# Patient Record
Sex: Female | Born: 1965 | Race: White | Hispanic: No | Marital: Married | Smoking: Current every day smoker
Health system: Southern US, Community
[De-identification: ages and names within clinical notes are randomized; demographics above are authoritative.]

## PROBLEM LIST (undated history)

## (undated) DIAGNOSIS — G4730 Sleep apnea, unspecified: Secondary | ICD-10-CM

## (undated) DIAGNOSIS — G709 Myoneural disorder, unspecified: Secondary | ICD-10-CM

## (undated) DIAGNOSIS — L409 Psoriasis, unspecified: Secondary | ICD-10-CM

## (undated) DIAGNOSIS — C801 Malignant (primary) neoplasm, unspecified: Secondary | ICD-10-CM

## (undated) HISTORY — PX: LAPAROSCOPY: SHX197

## (undated) HISTORY — PX: LAMINECTOMY: SHX219

---

## 2007-04-25 HISTORY — PX: ABDOMINAL HYSTERECTOMY: SHX81

## 2015-04-25 HISTORY — PX: TOTAL KNEE ARTHROPLASTY: SHX125

## 2018-04-24 DIAGNOSIS — K449 Diaphragmatic hernia without obstruction or gangrene: Secondary | ICD-10-CM

## 2018-04-24 HISTORY — DX: Diaphragmatic hernia without obstruction or gangrene: K44.9

## 2019-03-03 ENCOUNTER — Other Ambulatory Visit: Payer: Self-pay | Admitting: Urology

## 2019-03-31 NOTE — Patient Instructions (Signed)
DUE TO COVID-19 ONLY ONE VISITOR IS ALLOWED TO COME WITH YOU AND STAY IN THE WAITING ROOM ONLY DURING PRE OP AND PROCEDURE DAY OF SURGERY. THE 1 VISITOR MAY VISIT WITH YOU AFTER SURGERY IN YOUR PRIVATE ROOM DURING VISITING HOURS ONLY!  YOU NEED TO HAVE A COVID 19 TEST ON____12/10/2020___ @___1240____ , THIS TEST MUST BE DONE BEFORE SURGERY, COME  Julia Mcpherson  , [id number].  ([place]) ONCE YOUR COVID TEST IS COMPLETED, PLEASE BEGIN THE QUARANTINE INSTRUCTIONS AS OUTLINED IN YOUR HANDOUT.                Julia Mcpherson   Your procedure is scheduled on:    Report to [facility] Main  Entrance   Report to admitting at 0530 AM   Julia Mcpherson   Call this number if you have problems the morning of surgery [phone]    Remember: Do not eat food or drink liquids :After Midnight. BRUSH YOUR TEETH MORNING OF SURGERY AND RINSE YOUR MOUTH OUT, NO CHEWING GUM CANDY OR MINTS.     Take these medicines the morning of surgery with A SIP OF WATER: Protonix, Gabapentin. Zofran, Ultram and Robaxin as needed.                                You may not have any metal on your body including hair pins and              piercings  Do not wear jewelry, make-up, lotions, powders or perfumes, deodorant             Do not wear nail polish on your fingernails.  Do not shave  48 hours prior to surgery.              Men may shave face and neck.   Do not bring valuables to the hospital. [place].  Contacts, dentures or bridgework may not be worn into surgery.  Leave suitcase in the car. After surgery it may be brought to your room.   Julia Mcpherson - Preparing for Surgery Before surgery, you can play an important role.  Because skin is not sterile, your skin needs to be as free of germs as possible.  You can reduce the number of germs on your skin by washing with CHG (chlorahexidine  gluconate) soap before surgery.  CHG is an antiseptic cleaner which kills germs and bonds with the skin to continue killing germs even after washing. Please DO NOT use if you have an allergy to CHG or antibacterial soaps.  If your skin becomes reddened/irritated stop using the CHG and inform your nurse when you arrive at Short Stay. Do not shave (including legs and underarms) for at least 48 hours prior to the first CHG shower.  You may shave your face/neck.  Please follow these instructions carefully:  1.  Shower with CHG Soap the night before surgery and the  morning of surgery.  2.  If you choose to wash your hair, wash your hair first as usual with your normal  shampoo.  3.  After you shampoo, rinse your hair and body thoroughly to remove the shampoo.  4.  Use CHG as you would any other liquid soap.  You can apply chg directly to the skin and wash.  Gently with a scrungie or clean washcloth.  5.  Apply the CHG Soap to your body ONLY FROM THE NECK DOWN.   Do   not use on face/ open                           Wound or open sores. Avoid contact with eyes, ears mouth and   genitals (private parts).                       Wash face,  Genitals (private parts) with your normal soap.             6.  Wash thoroughly, paying special attention to the area where your    surgery  will be performed.  7.  Thoroughly rinse your body with warm water from the neck down.  8.  DO NOT shower/wash with your normal soap after using and rinsing off the CHG Soap.                9.  Pat yourself dry with a clean towel.            10.  Wear clean pajamas.            11.  Place clean sheets on your bed the night of your first shower and do not  sleep with pets. Day of Surgery : Do not apply any lotions/deodorants the morning of surgery.  Please wear clean clothes to the hospital/surgery center.  FAILURE TO FOLLOW THESE INSTRUCTIONS MAY RESULT IN THE CANCELLATION OF YOUR SURGERY  PATIENT  SIGNATURE_________________________________  NURSE SIGNATURE__________________________________  ________________________________________________________________________     Julia Mcpherson  An incentive spirometer is a tool that can help keep your lungs clear and active. This tool measures how well you are filling your lungs with each breath. Taking long deep breaths may help reverse or decrease the chance of developing breathing (pulmonary) problems (especially infection) following:  A long period of time when you are unable to move or be active. BEFORE THE PROCEDURE   If the spirometer includes an indicator to show your best effort, your nurse or respiratory therapist will set it to a desired goal.  If possible, sit up straight or lean slightly forward. Try not to slouch.  Hold the incentive spirometer in an upright position. INSTRUCTIONS FOR USE  1. Sit on the edge of your bed if possible, or sit up as far as you can in bed or on a chair. 2. Hold the incentive spirometer in an upright position. 3. Breathe out normally. 4. Place the mouthpiece in your mouth and seal your lips tightly around it. 5. Breathe in slowly and as deeply as possible, raising the piston or the ball toward the top of the column. 6. Hold your breath for 3-5 seconds or for as long as possible. Allow the piston or ball to fall to the bottom of the column. 7. Remove the mouthpiece from your mouth and breathe out normally. 8. Rest for a few seconds and repeat Steps 1 through 7 at least 10 times every 1-2 hours when you are awake. Take your time and take a few normal breaths between deep breaths. 9. The spirometer may include an indicator to show your best effort. Use the indicator as a goal to work toward during  each repetition. 10. After each set of 10 deep breaths, practice coughing to be sure your lungs are clear. If you have an incision (the cut made at the time of surgery), support your incision when  coughing by placing a pillow or rolled up towels firmly against it. Once you are able to get out of bed, walk around indoors and cough well. You may stop using the incentive spirometer when instructed by your caregiver.  RISKS AND COMPLICATIONS  Take your time so you do not get dizzy or light-headed.  If you are in pain, you may need to take or ask for pain medication before doing incentive spirometry. It is harder to take a deep breath if you are having pain. AFTER USE  Rest and breathe slowly and easily.  It can be helpful to keep track of a log of your progress. Your caregiver can provide you with a simple table to help with this. If you are using the spirometer at home, follow these instructions: [place] IF:   You are having difficultly using the spirometer.  You have trouble using the spirometer as often as instructed.  Your pain medication is not giving enough relief while using the spirometer.  You develop fever of 100.5 F (38.1 C) or higher. SEEK IMMEDIATE MEDICAL CARE IF:   You cough up bloody sputum that had not been present before.  You develop fever of 102 F (38.9 C) or greater.  You develop worsening pain at or near the incision site. MAKE SURE YOU:   Understand these instructions.  Will watch your condition.  Will get help right away if you are not doing well or get worse. Document Released: 08/21/2006 Document Revised: 07/03/2011 Document Reviewed: 10/22/2006 [facility] Patient Information 2014 [place], [place].   ________________________________________________________________________

## 2019-04-03 ENCOUNTER — Encounter (HOSPITAL_COMMUNITY)
Admission: RE | Admit: 2019-04-03 | Discharge: 2019-04-03 | Disposition: A | Discharge status: Home / Self Care | Payer: 59 | Source: Ambulatory Visit | Attending: Urology | Admitting: Urology

## 2019-04-03 ENCOUNTER — Other Ambulatory Visit (HOSPITAL_COMMUNITY)
Admission: RE | Admit: 2019-04-03 | Discharge: 2019-04-03 | Disposition: A | Discharge status: Home / Self Care | Payer: 59 | Source: Ambulatory Visit | Attending: Urology | Admitting: Urology

## 2019-04-03 ENCOUNTER — Encounter (HOSPITAL_COMMUNITY): Payer: Self-pay

## 2019-04-03 ENCOUNTER — Other Ambulatory Visit: Payer: Self-pay

## 2019-04-03 DIAGNOSIS — Z20828 Contact with and (suspected) exposure to other viral communicable diseases: Secondary | ICD-10-CM | POA: Diagnosis not present

## 2019-04-03 DIAGNOSIS — I4510 Unspecified right bundle-branch block: Secondary | ICD-10-CM | POA: Insufficient documentation

## 2019-04-03 DIAGNOSIS — Z01812 Encounter for preprocedural laboratory examination: Secondary | ICD-10-CM | POA: Insufficient documentation

## 2019-04-03 DIAGNOSIS — Z01810 Encounter for preprocedural cardiovascular examination: Secondary | ICD-10-CM | POA: Diagnosis present

## 2019-04-03 HISTORY — DX: Psoriasis, unspecified: L40.9

## 2019-04-03 HISTORY — DX: Sleep apnea, unspecified: G47.30

## 2019-04-03 HISTORY — DX: Malignant (primary) neoplasm, unspecified: C80.1

## 2019-04-03 HISTORY — DX: Myoneural disorder, unspecified: G70.9

## 2019-04-03 LAB — BASIC METABOLIC PANEL
Anion gap: 10 (ref 5–15)
BUN: 9 mg/dL (ref 6–20)
CO2: 25 mmol/L (ref 22–32)
Calcium: 9.3 mg/dL (ref 8.9–10.3)
Chloride: 101 mmol/L (ref 98–111)
Creatinine, Ser: 0.7 mg/dL (ref 0.44–1.00)
GFR calc Af Amer: >60 mL/min (ref >60)
GFR calc non Af Amer: >60 mL/min (ref >60)
Glucose, Bld: 96 mg/dL (ref 70–99)
Potassium: 4 mmol/L (ref 3.5–5.1)
Sodium: 136 mmol/L (ref 135–145)

## 2019-04-03 LAB — CBC
HCT: 44.4 % (ref 36.0–46.0)
Hemoglobin: 14.1 g/dL (ref 12.0–15.0)
MCH: 31.5 pg (ref 26.0–34.0)
MCHC: 31.8 g/dL (ref 30.0–36.0)
MCV: 99.1 fL (ref 80.0–100.0)
Platelets: 322 10*3/uL (ref 150–400)
RBC: 4.48 MIL/uL (ref 3.87–5.11)
RDW: 14.2 % (ref 11.5–15.5)
WBC: 5.6 10*3/uL (ref 4.0–10.5)
nRBC: 0 % (ref 0.0–0.2)

## 2019-04-03 LAB — ABO/RH: ABO/RH(D): O POS

## 2019-04-03 MED ORDER — MAGNESIUM CITRATE PO SOLN: 1.0000 | Freq: Once | ORAL | Status: DC

## 2019-04-03 NOTE — Progress Notes (Signed)
PCP - Gar Ponto, MD Cardiologist - n/a  Chest x-ray - n/a EKG - 04/03/2019 Stress Test -n/a  ECHO - n/a Cardiac Cath - n/a  Sleep Study - yes  CPAP - yes  Fasting Blood Sugar - n/a Checks Blood Sugar _____ times a day  Blood Thinner Instructions:NONE Aspirin Instructions: Last Dose:  Anesthesia review: No review needed.  Patient denies shortness of breath, fever, cough and chest pain at PAT appointment   Patient verbalized understanding of instructions that were given to them at the PAT appointment. Patient was also instructed that they will need to review over the PAT instructions again at home before surgery.

## 2019-04-04 LAB — NOVEL CORONAVIRUS, NAA (HOSP ORDER, SEND-OUT TO REF LAB; TAT 18-24 HRS): SARS-CoV-2, NAA: NOT DETECTED

## 2019-04-04 NOTE — H&P (Signed)
Julia Mcpherson         MRN: [id number]  DOB: April 07, 1966, 53 year old Female  SSN:    PRIMARY CARE:  Julia Na. Quillian Quince, MD  REFERRING:  Julia Na. Quillian Quince, MD  PROVIDER:  Raynelle Bring, M.D.  LOCATION:  [facility] [phone]      CC/HPI: CC: Left renal neoplasm  Physician requesting consult: Dr. Gar Ponto  Primary care physician: Dr. 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 She was noted to also have microscopic hematuria. Cystoscopy was unremarkable. She returns today after undergoing a hematuria protocol CT scan and to discuss options for treatment of her renal lesion.   Family history of kidney cancer: No.  Family history of ESRD: No.   Imaging: CT scan (02/11/19)  Side of renal neoplasm: Left  Size of renal neoplasm: 2.9 cm  Location of renal neoplasm: Interpolar  Exophytic or endophytic: Partially exophytic  Renal nephrometry score: 8x   Renal artery anatomy: Single renal artery  Renal vein anatomy: Single renal vein   Contralateral renal lesions: None.  Regional lymphadenopathy: None.  Adrenal masses: None.  Renal vein/IVC involvement: No.  Metastatic disease to the abdomen: No.   Chest imaging: CT scan (02/11/19): Negative.  LFTs: Normal.   Baseline renal function: Cr 0.5, eGFR > 60 ml/min   PMH: Past medical history is significant for hyperlipidemia, depression, anxiety, arthritis, GERD, sleep apnea.  PSH: TAH-BSO  ALLERGIES: Ace Inhibitors Codeine enbel sulfa   MEDICATIONS: Aspirin 81 mg tablet,chewable  Lipitor  Buspirone Hcl 5 mg tablet  Estradiol 0.1 mg/24 hour patch, transdermal weekly  Folic Acid  Methotrexate 25 mg/ml vial  Neurontin  Protonix 40 mg tablet, delayed release  Robaxin-750  Tramadol Hcl 100 mg tablet  Vitamin D2    Zofran    GU PSH: Cystoscopy - 02/04/2019 Locm 300-399Mg/Ml Iodine,1Ml - 02/11/2019, 02/11/2019     PSH Notes: total abdominal hysterectomy,    NON-GU PSH: Laminotomy; Single Lumbar Total Knee Replacement, Right   GU PMH: Left renal neoplasm - 02/04/2019 Microscopic hematuria - 02/04/2019   NON-GU PMH: Anxiety Arthritis Depression GERD Hypercholesterolemia Psoriasis, unspecified Sleep Apnea   FAMILY HISTORY: Heart Attack - Mother stroke - Mother   SOCIAL HISTORY: Marital Status: Married Preferred Language: English; Ethnicity: Not Hispanic Or Latino; Race: White Current Smoking Status: Patient smokes.   Tobacco Use Assessment Completed:  Used Tobacco in last 30 days?  Has never drank.  Drinks 3 caffeinated drinks per day.   REVIEW OF SYSTEMS:    GU Review Female:   Patient denies frequent urination, hard to postpone urination, burning /pain with urination, get up at night to urinate, leakage of urine, stream starts and stops, trouble starting your stream, have to strain to urinate, and currently pregnant.  Gastrointestinal (Lower):   Patient denies diarrhea and constipation.  Gastrointestinal (Upper):   Patient denies nausea and vomiting.  Constitutional:   Patient denies fever, night sweats, weight loss, and fatigue.  Skin:   Patient denies skin rash/ lesion and itching.  Eyes:   Patient denies blurred vision and double vision.  Ears/ Nose/ Throat:   Patient denies sinus problems and sore throat.  Hematologic/Lymphatic:   Patient denies swollen glands and easy bruising.  Cardiovascular:   Patient denies leg swelling  and chest pains.  Respiratory:   Patient denies cough and shortness of breath.  Endocrine:   Patient denies excessive thirst.  Musculoskeletal:   Patient denies back pain and joint pain.  Neurological:   Patient denies headaches and dizziness.  Psychologic:   Patient denies depression and anxiety.   VITAL SIGNS:      02/25/2019 03:04 PM  Weight 250 lb  / 113.4 kg  Height 67 in / 170.18 cm  BP 138/80 mmHg  Pulse 112 /min  Temperature 96.9 F / 36.0 C  BMI 39.2 kg/m   MULTI-SYSTEM PHYSICAL EXAMINATION:    Constitutional: Well-nourished. No physical deformities. Normally developed. Good grooming.  Neck: Neck symmetrical, not swollen. Normal tracheal position.  Respiratory: No labored breathing, no use of accessory muscles. Clear bilaterally.  Cardiovascular: Normal temperature, normal extremity pulses, no swelling, no varicosities. Regular rate and rhythm.  Lymphatic: No enlargement of neck, axillae, groin.  Skin: No paleness, no jaundice, no cyanosis. No lesion, no ulcer, no rash.  Neurologic / Psychiatric: Oriented to time, oriented to place, oriented to person. No depression, no anxiety, no agitation.  Gastrointestinal: No mass, no tenderness, no rigidity, obese abdomen. Well-healed lower midline incision.  Eyes: Normal conjunctivae. Normal eyelids.  Ears, Nose, Mouth, and Throat: Left ear no scars, no lesions, no masses. Right ear no scars, no lesions, no masses. Nose no scars, no lesions, no masses. Normal hearing. Normal lips.  Musculoskeletal: Normal gait and station of head and neck.    PAST DATA REVIEWED:  Source Of History:  Patient  Lab Test Review:   CMP  Urine Test Review:   Urinalysis  X-Ray Review: C.T. Abdomen/Pelvis: Reviewed Films.   Notes:                     CLINICAL DATA: Microhematuria   EXAM:  CT CHEST WITH CONTRAST   CT ABDOMEN AND PELVIS WITHOUT AND WITH CONTRAST   TECHNIQUE:  Multidetector CT imaging of the chest was performed following the  standard protocol following the bolus administration of intravenous  contrast.   Multidetector CT imaging of the abdomen and pelvis was performed  following the standard protocol before and following the bolus  administration of intravenous contrast.   CONTRAST: 125 mL Omnipaque 300 iodinated contrast IV   COMPARISON: None.   FINDINGS:  CT CHEST    Cardiovascular: Aortic atherosclerosis. Normal heart size. Coronary  artery calcifications. No pericardial effusion.   Mediastinum/Nodes: No enlarged mediastinal, hilar, or axillary lymph  nodes. Thyroid gland, trachea, and esophagus demonstrate no  significant findings.   Lungs/Pleura: Lungs are clear. No pleural effusion or pneumothorax.   Upper Abdomen: No acute abnormality.   Musculoskeletal: No chest wall mass or suspicious bone lesions  identified.   CT ABDOMEN AND PELVIS   Hepatobiliary: No solid liver abnormality is seen. No gallstones,  gallbladder wall thickening, or biliary dilatation.   Pancreas: Unremarkable. No pancreatic ductal dilatation or  surrounding inflammatory changes.   Spleen: Normal in size without significant abnormality.   Adrenals/Urinary Tract: Adrenal glands are unremarkable. No evidence  of urinary tract calculus or hydronephrosis. There is a complex  partially exophytic lesion of the lateral midportion of the left  kidney with thick, contrast enhancing septa and a possible mural  nodule, overall lesion dimensions measuring the 2.9 x 2.3 x 2.1 cm  (series 5, image 67, series 700, image 95). The left ureter is  poorly opacified on delayed phase imaging. No filling defect or  abnormality  of the right ureter or urinary bladder.   Stomach/Bowel: Stomach is within normal limits. Appendix appears  normal. No evidence of bowel wall thickening, distention, or  inflammatory changes. Occasional sigmoid diverticula.   Vascular/Lymphatic: Aortic atherosclerosis. No enlarged abdominal or  pelvic lymph nodes.   Reproductive: Status post hysterectomy.   Other: No abdominal wall hernia or abnormality. No abdominopelvic  ascites.   Musculoskeletal: No acute or significant osseous findings.   IMPRESSION:  1. There is a complex partially exophytic lesion of the lateral  midportion of the left kidney with thick, contrast enhancing septa  and a possible  mural nodule, overall lesion dimensions measuring the  2.9 x 2.3 x 2.1 cm (series 5, image 67, series 700, image 95).  Findings are highly concerning for renal cell carcinoma.   2. The left ureter is poorly opacified on delayed phase imaging. No  filling defect or abnormality of the right ureter or urinary  bladder.   3. No evidence of metastatic disease in the chest, abdomen, or  pelvis.   4. Coronary artery disease. Aortic Atherosclerosis (ICD10-I70.0).    Electronically Signed  By: Eddie Candle M.D.  On: 02/11/2019 16:58    PROCEDURES:         Urinalysis w/Scope - 81001 Dipstick Dipstick Cont'd Micro  Color: Yellow Bilirubin: Neg WBC/hpf: 0 - 5/hpf  Appearance: Clear Ketones: Neg RBC/hpf: 3 - 10/hpf  Specific Gravity: 1.015 Blood: Trace Bacteria: Rare (0-9/hpf)  pH: 6.5 Protein: Neg Cystals: NS (Not Seen)  Glucose: Neg Urobilinogen: 0.2 Casts: Hyaline    Nitrites: Neg Trichomonas: Not Present    Leukocyte Esterase: Neg Mucous: Not Present      Epithelial Cells: 0 - 5/hpf      Yeast: NS (Not Seen)      Sperm: Not Present   Notes:    ASSESSMENT:      ICD-10 Details  1 GU:   Left renal neoplasm - X52.841   2   Microscopic hematuria - R31.21    PLAN:          Schedule Return Visit/Planned Activity: Other See Visit Notes             Note: Will call to schedule surgery.         Document Letter(s):  Created for Patient: Clinical Summary        Notes:   1. Left renal neoplasm concerning for malignancy: We reviewed her CT scan findings which confirmed that she has a partially cystic but enhancing lesion measuring 2.9 cm off the left kidney. 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 The patient was provided information regarding their renal mass including the relative risk of benign versus malignant pathology and the natural history of renal cell carcinoma and other possible malignancies of the kidney. The role of renal biopsy, laboratory testing, and imaging  studies to further characterize renal masses and/or the presence of metastatic disease were explained. We discussed the role of active surveillance, surgical therapy with both radical nephrectomy and nephron-sparing surgery, and ablative therapy in the treatment of renal masses. In addition, we discussed our goals of providing an accurate diagnosis and oncologic control while maintaining optimal renal function as appropriate based on the size, location, and complexity of their renal mass as well as their co-morbidities. We have discussed the risks of treatment in detail including but not limited to bleeding, infection, heart attack, stroke, death, venothromoboembolism, cancer recurrence, injury/damage to surrounding organs and structures, urine leak, the possibility of  open surgical conversion for patients undergoing minimally invasive surgery, the risk of developing chronic kidney disease and its associated implications, and the potential risk of end stage renal disease possibly necessitating dialysis.   After reviewing options, she does wish to proceed with a left robot assisted laparoscopic partial nephrectomy in this will be scheduled.   2. Microscopic hematuria: Her recent cystoscopy did demonstrate an erythematous area adjacent to the dome of the bladder. Her bladder washing for cytology did demonstrate some rare atypical cells. We discussed these findings in addition to her hematuria protocol CT scan. Ultimately, considering the fact that she is going to undergo general anesthesia for treatment of her renal lesion, we discussed the option of proceeding with cystoscopy and biopsy of her erythematous lesion she did wish to proceed with this. Potential risks, complications, and expected recovery process was discussed. We will therefore plan to perform this at the time of a partial nephrectomy.   Cc: Dr. Gar Ponto

## 2019-04-06 MED ORDER — DEXTROSE 5 % IV SOLN
3.0000 g | Freq: Once | INTRAVENOUS | Status: AC
  Administered 2019-04-07: 3 g via INTRAVENOUS
  Filled 2019-04-06: qty 3

## 2019-04-07 ENCOUNTER — Other Ambulatory Visit: Payer: Self-pay

## 2019-04-07 ENCOUNTER — Encounter (HOSPITAL_COMMUNITY)
Admission: RE | Disposition: A | Discharge status: Home / Self Care | Payer: Self-pay | Source: Home / Self Care | Attending: Urology

## 2019-04-07 ENCOUNTER — Observation Stay (HOSPITAL_COMMUNITY)
Admission: RE | Admit: 2019-04-07 | Discharge: 2019-04-08 | Disposition: A | Discharge status: Home / Self Care | Payer: 59 | Attending: Urology | Admitting: Urology

## 2019-04-07 ENCOUNTER — Encounter (HOSPITAL_COMMUNITY): Payer: Self-pay | Admitting: Urology

## 2019-04-07 ENCOUNTER — Ambulatory Visit (HOSPITAL_COMMUNITY): Payer: 59 | Admitting: Physician Assistant

## 2019-04-07 ENCOUNTER — Ambulatory Visit (HOSPITAL_COMMUNITY): Payer: 59 | Admitting: Certified Registered Nurse Anesthetist

## 2019-04-07 DIAGNOSIS — D49512 Neoplasm of unspecified behavior of left kidney: Secondary | ICD-10-CM | POA: Diagnosis present

## 2019-04-07 DIAGNOSIS — Z882 Allergy status to sulfonamides status: Secondary | ICD-10-CM | POA: Insufficient documentation

## 2019-04-07 DIAGNOSIS — E785 Hyperlipidemia, unspecified: Secondary | ICD-10-CM | POA: Insufficient documentation

## 2019-04-07 DIAGNOSIS — F17200 Nicotine dependence, unspecified, uncomplicated: Secondary | ICD-10-CM | POA: Diagnosis not present

## 2019-04-07 DIAGNOSIS — F419 Anxiety disorder, unspecified: Secondary | ICD-10-CM | POA: Diagnosis not present

## 2019-04-07 DIAGNOSIS — E7800 Pure hypercholesterolemia, unspecified: Secondary | ICD-10-CM | POA: Diagnosis not present

## 2019-04-07 DIAGNOSIS — Z79899 Other long term (current) drug therapy: Secondary | ICD-10-CM | POA: Insufficient documentation

## 2019-04-07 DIAGNOSIS — Z6841 Body Mass Index (BMI) 40.0 and over, adult: Secondary | ICD-10-CM | POA: Diagnosis not present

## 2019-04-07 DIAGNOSIS — Z7982 Long term (current) use of aspirin: Secondary | ICD-10-CM | POA: Insufficient documentation

## 2019-04-07 DIAGNOSIS — G709 Myoneural disorder, unspecified: Secondary | ICD-10-CM | POA: Insufficient documentation

## 2019-04-07 DIAGNOSIS — C642 Malignant neoplasm of left kidney, except renal pelvis: Secondary | ICD-10-CM | POA: Diagnosis not present

## 2019-04-07 DIAGNOSIS — K219 Gastro-esophageal reflux disease without esophagitis: Secondary | ICD-10-CM | POA: Insufficient documentation

## 2019-04-07 DIAGNOSIS — G4730 Sleep apnea, unspecified: Secondary | ICD-10-CM | POA: Diagnosis not present

## 2019-04-07 DIAGNOSIS — Z885 Allergy status to narcotic agent status: Secondary | ICD-10-CM | POA: Diagnosis not present

## 2019-04-07 DIAGNOSIS — Z96651 Presence of right artificial knee joint: Secondary | ICD-10-CM | POA: Diagnosis not present

## 2019-04-07 DIAGNOSIS — N329 Bladder disorder, unspecified: Secondary | ICD-10-CM | POA: Diagnosis not present

## 2019-04-07 DIAGNOSIS — L409 Psoriasis, unspecified: Secondary | ICD-10-CM | POA: Insufficient documentation

## 2019-04-07 HISTORY — PX: ROBOTIC ASSITED PARTIAL NEPHRECTOMY: SHX6087

## 2019-04-07 HISTORY — PX: CYSTOSCOPY WITH FULGERATION: SHX6638

## 2019-04-07 LAB — BASIC METABOLIC PANEL
Anion gap: 13 (ref 5–15)
BUN: 10 mg/dL (ref 6–20)
CO2: 21 mmol/L — ABNORMAL LOW (ref 22–32)
Calcium: 9.2 mg/dL (ref 8.9–10.3)
Chloride: 102 mmol/L (ref 98–111)
Creatinine, Ser: 1.12 mg/dL — ABNORMAL HIGH (ref 0.44–1.00)
GFR calc Af Amer: >60 mL/min (ref >60)
GFR calc non Af Amer: 56 mL/min — ABNORMAL LOW (ref >60)
Glucose, Bld: 159 mg/dL — ABNORMAL HIGH (ref 70–99)
Potassium: 3.7 mmol/L (ref 3.5–5.1)
Sodium: 136 mmol/L (ref 135–145)

## 2019-04-07 LAB — TYPE AND SCREEN
ABO/RH(D): O POS
Antibody Screen: NEGATIVE

## 2019-04-07 LAB — HEMOGLOBIN AND HEMATOCRIT, BLOOD
HCT: 45.9 % (ref 36.0–46.0)
Hemoglobin: 14.9 g/dL (ref 12.0–15.0)

## 2019-04-07 SURGERY — NEPHRECTOMY, PARTIAL, ROBOT-ASSISTED
Anesthesia: General

## 2019-04-07 MED ORDER — STERILE WATER FOR IRRIGATION IR SOLN
Status: DC | PRN
  Administered 2019-04-07: 3000 mL via INTRAVESICAL

## 2019-04-07 MED ORDER — DOCUSATE SODIUM 100 MG PO CAPS
100.0000 mg | ORAL_CAPSULE | Freq: Two times a day (BID) | ORAL | Status: DC
  Administered 2019-04-07 – 2019-04-08 (×2): 100 mg via ORAL
  Filled 2019-04-07 (×3): qty 1

## 2019-04-07 MED ORDER — ONDANSETRON HCL 4 MG/2ML IJ SOLN: 4.0000 mg | INTRAMUSCULAR | Status: DC | PRN

## 2019-04-07 MED ORDER — PROPOFOL 10 MG/ML IV BOLUS
INTRAVENOUS | Status: DC | PRN
  Administered 2019-04-07: 180 mg via INTRAVENOUS

## 2019-04-07 MED ORDER — FENTANYL CITRATE (PF) 100 MCG/2ML IJ SOLN
25.0000 ug | INTRAMUSCULAR | Status: DC | PRN
  Administered 2019-04-07 (×2): 50 ug via INTRAVENOUS

## 2019-04-07 MED ORDER — LACTATED RINGERS IV SOLN
INTRAVENOUS | Status: DC | PRN
  Administered 2019-04-07 (×2): via INTRAVENOUS

## 2019-04-07 MED ORDER — ROCURONIUM BROMIDE 10 MG/ML (PF) SYRINGE
PREFILLED_SYRINGE | INTRAVENOUS | Status: AC
  Filled 2019-04-07: qty 10

## 2019-04-07 MED ORDER — DIPHENHYDRAMINE HCL 12.5 MG/5ML PO ELIX: 12.5000 mg | ORAL_SOLUTION | Freq: Four times a day (QID) | ORAL | Status: DC | PRN

## 2019-04-07 MED ORDER — CHLORHEXIDINE GLUCONATE CLOTH 2 % EX PADS: 6.0000 | MEDICATED_PAD | Freq: Every day | CUTANEOUS | Status: DC

## 2019-04-07 MED ORDER — ACETAMINOPHEN 10 MG/ML IV SOLN
1000.0000 mg | Freq: Four times a day (QID) | INTRAVENOUS | Status: AC
  Administered 2019-04-07 – 2019-04-08 (×4): 1000 mg via INTRAVENOUS
  Filled 2019-04-07 (×4): qty 100

## 2019-04-07 MED ORDER — MORPHINE SULFATE (PF) 2 MG/ML IV SOLN
2.0000 mg | INTRAVENOUS | Status: DC | PRN
  Administered 2019-04-07 – 2019-04-08 (×7): 4 mg via INTRAVENOUS
  Filled 2019-04-07 (×7): qty 2

## 2019-04-07 MED ORDER — ONDANSETRON HCL 4 MG/2ML IJ SOLN
INTRAMUSCULAR | Status: AC
  Filled 2019-04-07: qty 2

## 2019-04-07 MED ORDER — FENTANYL CITRATE (PF) 100 MCG/2ML IJ SOLN
INTRAMUSCULAR | Status: AC
  Filled 2019-04-07: qty 4

## 2019-04-07 MED ORDER — OXYCODONE HCL 5 MG PO TABS: 5.0000 mg | ORAL_TABLET | Freq: Once | ORAL | Status: DC | PRN

## 2019-04-07 MED ORDER — DEXAMETHASONE SODIUM PHOSPHATE 10 MG/ML IJ SOLN
INTRAMUSCULAR | Status: DC | PRN
  Administered 2019-04-07: 10 mg via INTRAVENOUS

## 2019-04-07 MED ORDER — LACTATED RINGERS IV SOLN
INTRAVENOUS | Status: DC
  Administered 2019-04-07: 06:00:00 via INTRAVENOUS

## 2019-04-07 MED ORDER — FENTANYL CITRATE (PF) 250 MCG/5ML IJ SOLN
INTRAMUSCULAR | Status: AC
  Filled 2019-04-07: qty 5

## 2019-04-07 MED ORDER — PHENYLEPHRINE HCL (PRESSORS) 10 MG/ML IV SOLN
INTRAVENOUS | Status: AC
  Filled 2019-04-07: qty 1

## 2019-04-07 MED ORDER — CEFAZOLIN SODIUM-DEXTROSE 1-4 GM/50ML-% IV SOLN
1.0000 g | Freq: Three times a day (TID) | INTRAVENOUS | Status: AC
  Administered 2019-04-07 (×2): 1 g via INTRAVENOUS
  Filled 2019-04-07 (×2): qty 50

## 2019-04-07 MED ORDER — ONDANSETRON HCL 4 MG/2ML IJ SOLN
INTRAMUSCULAR | Status: DC | PRN
  Administered 2019-04-07: 4 mg via INTRAVENOUS

## 2019-04-07 MED ORDER — SUGAMMADEX SODIUM 200 MG/2ML IV SOLN
INTRAVENOUS | Status: DC | PRN
  Administered 2019-04-07: 400 mg via INTRAVENOUS

## 2019-04-07 MED ORDER — SUCCINYLCHOLINE CHLORIDE 200 MG/10ML IV SOSY
PREFILLED_SYRINGE | INTRAVENOUS | Status: DC | PRN
  Administered 2019-04-07: 120 mg via INTRAVENOUS

## 2019-04-07 MED ORDER — MIDAZOLAM HCL 5 MG/5ML IJ SOLN
INTRAMUSCULAR | Status: DC | PRN
  Administered 2019-04-07: 2 mg via INTRAVENOUS

## 2019-04-07 MED ORDER — LIDOCAINE 2% (20 MG/ML) 5 ML SYRINGE
INTRAMUSCULAR | Status: DC | PRN
  Administered 2019-04-07: 80 mg via INTRAVENOUS

## 2019-04-07 MED ORDER — FENTANYL CITRATE (PF) 250 MCG/5ML IJ SOLN
INTRAMUSCULAR | Status: DC | PRN
  Administered 2019-04-07 (×5): 50 ug via INTRAVENOUS

## 2019-04-07 MED ORDER — BUPIVACAINE LIPOSOME 1.3 % IJ SUSP
20.0000 mL | Freq: Once | INTRAMUSCULAR | Status: AC
  Administered 2019-04-07: 20 mL
  Filled 2019-04-07: qty 20

## 2019-04-07 MED ORDER — METHOCARBAMOL 500 MG IVPB - SIMPLE MED
INTRAVENOUS | Status: AC
  Filled 2019-04-07: qty 50

## 2019-04-07 MED ORDER — OXYCODONE HCL 5 MG/5ML PO SOLN: 5.0000 mg | Freq: Once | ORAL | Status: DC | PRN

## 2019-04-07 MED ORDER — SODIUM CHLORIDE (PF) 0.9 % IJ SOLN
INTRAMUSCULAR | Status: DC | PRN
  Administered 2019-04-07: 20 mL

## 2019-04-07 MED ORDER — PROPOFOL 10 MG/ML IV BOLUS
INTRAVENOUS | Status: AC
  Filled 2019-04-07: qty 20

## 2019-04-07 MED ORDER — LIDOCAINE 2% (20 MG/ML) 5 ML SYRINGE
INTRAMUSCULAR | Status: AC
  Filled 2019-04-07: qty 5

## 2019-04-07 MED ORDER — MIDAZOLAM HCL 2 MG/2ML IJ SOLN
INTRAMUSCULAR | Status: AC
  Filled 2019-04-07: qty 2

## 2019-04-07 MED ORDER — PHENYLEPHRINE 40 MCG/ML (10ML) SYRINGE FOR IV PUSH (FOR BLOOD PRESSURE SUPPORT)
PREFILLED_SYRINGE | INTRAVENOUS | Status: DC | PRN
  Administered 2019-04-07 (×4): 80 ug via INTRAVENOUS

## 2019-04-07 MED ORDER — PHENYLEPHRINE 40 MCG/ML (10ML) SYRINGE FOR IV PUSH (FOR BLOOD PRESSURE SUPPORT)
PREFILLED_SYRINGE | INTRAVENOUS | Status: AC
  Filled 2019-04-07: qty 20

## 2019-04-07 MED ORDER — ROCURONIUM BROMIDE 10 MG/ML (PF) SYRINGE
PREFILLED_SYRINGE | INTRAVENOUS | Status: DC | PRN
  Administered 2019-04-07: 20 mg via INTRAVENOUS
  Administered 2019-04-07: 40 mg via INTRAVENOUS
  Administered 2019-04-07: 60 mg via INTRAVENOUS

## 2019-04-07 MED ORDER — DIPHENHYDRAMINE HCL 50 MG/ML IJ SOLN: 12.5000 mg | Freq: Four times a day (QID) | INTRAMUSCULAR | Status: DC | PRN

## 2019-04-07 MED ORDER — SODIUM CHLORIDE (PF) 0.9 % IJ SOLN
INTRAMUSCULAR | Status: AC
  Filled 2019-04-07: qty 50

## 2019-04-07 MED ORDER — ONDANSETRON HCL 4 MG/2ML IJ SOLN: 4.0000 mg | Freq: Four times a day (QID) | INTRAMUSCULAR | Status: DC | PRN

## 2019-04-07 MED ORDER — TRAMADOL HCL 50 MG PO TABS: 50.0000 mg | ORAL_TABLET | Freq: Four times a day (QID) | ORAL | 0 refills | Status: DC | PRN

## 2019-04-07 MED ORDER — FENTANYL CITRATE (PF) 100 MCG/2ML IJ SOLN
INTRAMUSCULAR | Status: AC
  Filled 2019-04-07: qty 2

## 2019-04-07 MED ORDER — DEXTROSE-NACL 5-0.45 % IV SOLN
INTRAVENOUS | Status: DC
  Administered 2019-04-07 (×2): via INTRAVENOUS

## 2019-04-07 MED ORDER — PHENYLEPHRINE HCL-NACL 10-0.9 MG/250ML-% IV SOLN
INTRAVENOUS | Status: DC | PRN
  Administered 2019-04-07: 25 ug/min via INTRAVENOUS

## 2019-04-07 MED ORDER — SUCCINYLCHOLINE CHLORIDE 200 MG/10ML IV SOSY
PREFILLED_SYRINGE | INTRAVENOUS | Status: AC
  Filled 2019-04-07: qty 10

## 2019-04-07 MED ORDER — DEXAMETHASONE SODIUM PHOSPHATE 10 MG/ML IJ SOLN
INTRAMUSCULAR | Status: AC
  Filled 2019-04-07: qty 1

## 2019-04-07 SURGICAL SUPPLY — 71 items
APPLICATOR SURGIFLO ENDO (HEMOSTASIS) ×4 IMPLANT
BAG URINE DRAIN 2000ML AR STRL (UROLOGICAL SUPPLIES) IMPLANT
BAG URO CATCHER STRL LF (MISCELLANEOUS) ×4 IMPLANT
CHLORAPREP W/TINT 26 (MISCELLANEOUS) ×4 IMPLANT
CLIP VESOLOCK LG 6/CT PURPLE (CLIP) ×4 IMPLANT
CLIP VESOLOCK MED LG 6/CT (CLIP) ×8 IMPLANT
COVER SURGICAL LIGHT HANDLE (MISCELLANEOUS) ×4 IMPLANT
COVER TIP SHEARS 8 DVNC (MISCELLANEOUS) ×2 IMPLANT
COVER TIP SHEARS 8MM DA VINCI (MISCELLANEOUS) ×2
COVER WAND RF STERILE (DRAPES) IMPLANT
DECANTER SPIKE VIAL GLASS SM (MISCELLANEOUS) ×4 IMPLANT
DERMABOND ADVANCED (GAUZE/BANDAGES/DRESSINGS) ×2
DERMABOND ADVANCED .7 DNX12 (GAUZE/BANDAGES/DRESSINGS) ×2 IMPLANT
DRAIN CHANNEL 15F RND FF 3/16 (WOUND CARE) ×4 IMPLANT
DRAPE ARM DVNC X/XI (DISPOSABLE) ×8 IMPLANT
DRAPE COLUMN DVNC XI (DISPOSABLE) ×2 IMPLANT
DRAPE DA VINCI XI ARM (DISPOSABLE) ×8
DRAPE DA VINCI XI COLUMN (DISPOSABLE) ×2
DRAPE INCISE IOBAN 66X45 STRL (DRAPES) ×4 IMPLANT
DRAPE SHEET LG 3/4 BI-LAMINATE (DRAPES) ×4 IMPLANT
ELECT REM PT RETURN 15FT ADLT (MISCELLANEOUS) ×8 IMPLANT
EVACUATOR SILICONE 100CC (DRAIN) ×4 IMPLANT
GLOVE BIO SURGEON STRL SZ 6.5 (GLOVE) ×3 IMPLANT
GLOVE BIO SURGEON STRL SZ8 (GLOVE) ×12 IMPLANT
GLOVE BIO SURGEONS STRL SZ 6.5 (GLOVE) ×1
GLOVE BIOGEL M STRL SZ7.5 (GLOVE) ×8 IMPLANT
GLOVE BIOGEL PI IND STRL 8.5 (GLOVE) ×4 IMPLANT
GLOVE BIOGEL PI INDICATOR 8.5 (GLOVE) ×4
GOWN STRL REUS W/TWL LRG LVL3 (GOWN DISPOSABLE) ×12 IMPLANT
GOWN STRL REUS W/TWL XL LVL3 (GOWN DISPOSABLE) ×4 IMPLANT
HEMOSTAT SURGICEL 4X8 (HEMOSTASIS) IMPLANT
HOLDER FOLEY CATH W/STRAP (MISCELLANEOUS) ×4 IMPLANT
IRRIG SUCT STRYKERFLOW 2 WTIP (MISCELLANEOUS) ×4
IRRIGATION SUCT STRKRFLW 2 WTP (MISCELLANEOUS) ×2 IMPLANT
IV LACTATED RINGERS 1000ML (IV SOLUTION) ×4 IMPLANT
KIT BASIN OR (CUSTOM PROCEDURE TRAY) ×4 IMPLANT
KIT TURNOVER KIT A (KITS) ×4 IMPLANT
LOOP CUT BIPOLAR 24F LRG (ELECTROSURGICAL) IMPLANT
MANIFOLD NEPTUNE II (INSTRUMENTS) ×4 IMPLANT
PACK CYSTO (CUSTOM PROCEDURE TRAY) ×4 IMPLANT
PENCIL SMOKE EVACUATOR (MISCELLANEOUS) IMPLANT
POUCH SPECIMEN RETRIEVAL 10MM (ENDOMECHANICALS) IMPLANT
PROTECTOR NERVE ULNAR (MISCELLANEOUS) ×8 IMPLANT
SEAL CANN UNIV 5-8 DVNC XI (MISCELLANEOUS) ×8 IMPLANT
SEAL XI 5MM-8MM UNIVERSAL (MISCELLANEOUS) ×8
SET TUBE SMOKE EVAC HIGH FLOW (TUBING) ×4 IMPLANT
SOLUTION ELECTROLUBE (MISCELLANEOUS) ×4 IMPLANT
SURGIFLO W/THROMBIN 8M KIT (HEMOSTASIS) ×4 IMPLANT
SUT ETHILON 3 0 PS 1 (SUTURE) ×4 IMPLANT
SUT MNCRL AB 4-0 PS2 18 (SUTURE) ×8 IMPLANT
SUT NOVA 0 T19/GS 22DT (SUTURE) ×8 IMPLANT
SUT V-LOC BARB 180 2/0GR6 GS22 (SUTURE) ×4
SUT VIC AB 0 CT1 27 (SUTURE) ×2
SUT VIC AB 0 CT1 27XBRD ANTBC (SUTURE) ×2 IMPLANT
SUT VIC AB 2-0 SH 27 (SUTURE) ×2
SUT VIC AB 2-0 SH 27X BRD (SUTURE) ×2 IMPLANT
SUT VICRYL 0 UR6 27IN ABS (SUTURE) ×4 IMPLANT
SUT VLOC BARB 180 ABS3/0GR12 (SUTURE) ×4
SUTURE V-LC BRB 180 2/0GR6GS22 (SUTURE) ×2 IMPLANT
SUTURE VLOC BRB 180 ABS3/0GR12 (SUTURE) ×2 IMPLANT
SYRINGE IRR TOOMEY STRL 70CC (SYRINGE) IMPLANT
TOWEL OR 17X26 10 PK STRL BLUE (TOWEL DISPOSABLE) ×4 IMPLANT
TRAY FOLEY MTR SLVR 16FR STAT (SET/KITS/TRAYS/PACK) ×4 IMPLANT
TRAY LAPAROSCOPIC (CUSTOM PROCEDURE TRAY) ×4 IMPLANT
TROCAR 12M 150ML BLUNT (TROCAR) ×4 IMPLANT
TROCAR ENDOPATH XCEL 12X100 BL (ENDOMECHANICALS) ×4 IMPLANT
TROCAR XCEL 12X100 BLDLESS (ENDOMECHANICALS) ×4 IMPLANT
TUBING CONNECTING 10 (TUBING) ×3 IMPLANT
TUBING CONNECTING 10' (TUBING) ×1
TUBING UROLOGY SET (TUBING) IMPLANT
WATER STERILE IRR 1000ML POUR (IV SOLUTION) ×4 IMPLANT

## 2019-04-07 NOTE — Anesthesia Preprocedure Evaluation (Signed)
Anesthesia Evaluation  Patient identified by MRN, date of birth, ID band Patient awake    Reviewed: Allergy & Precautions, H&P , NPO status , Patient's Chart, lab work & pertinent test results  Airway Mallampati: II   Neck ROM: full    Dental   Pulmonary sleep apnea , Current Smoker and Patient abstained from smoking.,    breath sounds clear to auscultation       Cardiovascular negative cardio ROS   Rhythm:regular Rate:Normal     Neuro/Psych  Neuromuscular disease    GI/Hepatic hiatal hernia,   Endo/Other  Morbid obesity  Renal/GU Renal diseaseRenal cell CA     Musculoskeletal   Abdominal   Peds  Hematology   Anesthesia Other Findings   Reproductive/Obstetrics                             Anesthesia Physical Anesthesia Plan  ASA: II  Anesthesia Plan: General   Post-op Pain Management:    Induction: Intravenous  PONV Risk Score and Plan: 2 and Ondansetron, Dexamethasone, Midazolam and Treatment may vary due to age or medical condition  Airway Management Planned: Oral ETT  Additional Equipment:   Intra-op Plan:   Post-operative Plan: Extubation in OR  Informed Consent: I have reviewed the patients History and Physical, chart, labs and discussed the procedure including the risks, benefits and alternatives for the proposed anesthesia with the patient or authorized representative who has indicated his/her understanding and acceptance.       Plan Discussed with: CRNA, Anesthesiologist and Surgeon  Anesthesia Plan Comments:         Anesthesia Quick Evaluation

## 2019-04-07 NOTE — Progress Notes (Signed)
Urology Progress Note   Day of Surgery from left partial nephrectomy.   Subjective: Extubated, uneventful PACU recovery. Transfer to floor. Pain tolerable. No nausea or emesis. Otherwise doing well  Objective: Vital signs in last 24 hours: Temp:  [98 F (36.7 C)-98.6 F (37 C)] 98.1 F (36.7 C) (12/14 1311) Pulse Rate:  [88-127] 90 (12/14 1311) Resp:  [18-19] 19 (12/14 1311) BP: (133-148)/(84-88) 135/84 (12/14 1311) SpO2:  [97 %-100 %] 97 % (12/14 1311) Weight:  [124.1 kg] 124.1 kg (12/14 0601)  Intake/Output from previous day: No intake/output data recorded. Intake/Output this shift: Total I/O In: 3043.1 [I.V.:2843.1; IV Piggyback:200] Out: 140 [Urine:50; Drains:70; Blood:20]  Physical Exam:  General: Alert and oriented CV: Regular rate Lungs: No increased work of breathing Abdomen: Soft, appropriately tender. Incisions c/d/i. JP SS GU: Foley in place draining clear yellow urine  Ext: NT, No erythema  Lab Results: Recent Labs    04/07/19 1215  HGB 14.9  HCT 45.9   Recent Labs    04/07/19 1215  NA 136  K 3.7  CL 102  CO2 21*  GLUCOSE 159*  BUN 10  CREATININE 1.12*  CALCIUM 9.2    Studies/Results: No results found.  Assessment/Plan:  53 y.o. female s/p left partial nephrectomy.  Overall doing well in the immediate postoperative period.  Clear liquids  Maintenance IV fluids Maintain Foley catheter. Maintain JP drain to suction. Bedrest tonight. A.m. labs.  Dispo: Pending   LOS: 0 days

## 2019-04-07 NOTE — Anesthesia Procedure Notes (Signed)
Procedure Name: Intubation Date/Time: 04/07/2019 7:49 AM Performed by: Mitzie Na, CRNA Pre-anesthesia Checklist: Patient identified, Emergency Drugs available, Suction available and Patient being monitored Patient Re-evaluated:Patient Re-evaluated prior to induction Oxygen Delivery Method: Circle system utilized Preoxygenation: Pre-oxygenation with 100% oxygen Induction Type: IV induction and Rapid sequence Laryngoscope Size: Mac and 3 Grade View: Grade I Tube type: Oral Tube size: 7.5 mm Number of attempts: 1 Airway Equipment and Method: Stylet and Oral airway Placement Confirmation: ETT inserted through vocal cords under direct vision,  positive ETCO2 and breath sounds checked- equal and bilateral Secured at: 24 cm Tube secured with: Tape Dental Injury: Teeth and Oropharynx as per pre-operative assessment

## 2019-04-07 NOTE — Transfer of Care (Signed)
Immediate Anesthesia Transfer of Care Note  Patient: Julia Mcpherson  Procedure(s) Performed: XI ROBOTIC ASSITED LAPAROSCOPIC PARTIAL NEPHRECTOMY (Left ) CYSTOSCOPY (N/A )  Patient Location: PACU  Anesthesia Type:General  Level of Consciousness: awake, alert , oriented and patient cooperative  Airway & Oxygen Therapy: Patient Spontanous Breathing and Patient connected to face mask oxygen  Post-op Assessment: Report given to RN, Post -op Vital signs reviewed and stable and Patient moving all extremities  Post vital signs: Reviewed and stable  Last Vitals:  Vitals Value Taken Time  BP 148/86 04/07/19 1139  Temp    Pulse 88 04/07/19 1141  Resp 18 04/07/19 1141  SpO2 100 % 04/07/19 1141  Vitals shown include unvalidated device data.  Last Pain:  Vitals:   04/07/19 0601  TempSrc: Oral  PainSc: 4       Patients Stated Pain Goal: 4 ([id number])  Complications: No apparent anesthesia complications

## 2019-04-07 NOTE — Interval H&P Note (Signed)
History and Physical Interval Note:  04/07/2019 6:50 AM  Julia Mcpherson  has presented today for surgery, with the diagnosis of LEFT RENAL NEOPLASM, BLADDER LESION.  The various methods of treatment have been discussed with the patient and family. After consideration of risks, benefits and other options for treatment, the patient has consented to  Procedure(s): XI ROBOTIC ASSITED LAPAROSCOPIC PARTIAL NEPHRECTOMY (Left) CYSTOSCOPY WITH BLADDER BIOPSY (N/A) as a surgical intervention.  The patient's history has been reviewed, patient examined, no change in status, stable for surgery.  I have reviewed the patient's chart and labs.  Questions were answered to the patient's satisfaction.     [employer]

## 2019-04-07 NOTE — Op Note (Signed)
Preoperative diagnosis: Left renal neoplasm, bladder lesion  Postoperative diagnosis: Left renal neoplasm, history of bladder lesion  Procedure:  1. Left robotic-assisted laparoscopic partial nephrectomy 2. Cystoscopy  Surgeon: Pryor Curia. M.D.  Assistant(s): Debbrah Alar, PA-C  An assistant was required for this surgical procedure.  The duties of the assistant included but were not limited to suctioning, passing suture, camera manipulation, retraction. This procedure would not be able to be performed without an Environmental consultant.  Resident: Dr. Sharlot Gowda  Anesthesia: General  Complications: None  EBL: 25 mL  IVF:  1400 mL crystalloid  Specimens: 1. Left renal neoplasm  Disposition of specimens: Pathology  Intraoperative findings:       1. Warm renal ischemia time: 14 minutes  Drains: 1. # 15 Blake perinephric drain  Indication:  Julia Mcpherson is a 53 y.o. year old patient with a left renal neoplasm. She also has a history of microscopic hematuria and was noted to have an erythematous area at the dome of the bladder on office cystoscopy.  After a thorough review of the management options for their renal mass, they elected to proceed with surgical treatment and the above procedure.  We have discussed the potential benefits and risks of the procedure, side effects of the proposed treatment, the likelihood of the patient achieving the goals of the procedure, and any potential problems that might occur during the procedure or recuperation. Informed consent has been obtained.   Description of procedure:  The patient was taken to the operating room and a general anesthetic was administered. The patient was given preoperative antibiotics, placed in the dorsal lithotomy position and prepped and draped.  Preoperative time out was performed.  Rigid cystoscopy was performed and the bladder was systematically examined with a 30 and 70 degree lens.  No abnormal mucosal areas were  noted and specifically no tumors were found.  Considering the resolution of her prior lesion, the suspicious for malignancy was low and no biopsy was necessary.  A 16 Fr Foley catheter was then placed under sterile conditions.   She was repositioned in the left modified flank position with care to pad all potential pressure points, and prepped and draped in the usual sterile fashion. Next a preoperative timeout was performed.  A site was selected in the upper midline for initial port placement. This was placed using a standard open Hassan technique which allowed entry into the peritoneal cavity under direct vision and without difficulty. A 12 mm port was placed and a pneumoperitoneum established. The camera was then used to inspect the abdomen and there was no evidence of any intra-abdominal injuries or other abnormalities. The remaining abdominal ports were then placed. 8 mm robotic ports were placed in the left upper quadrant, left lower quadrant, and far left lateral abdominal wall. A 8 mm port was placed to the left of the midline just off the rectus muscle for the camera.. All ports were placed under direct vision without difficulty. The surgical cart was then docked.   Utilizing the cautery scissors, the white line of Toldt was incised allowing the colon to be mobilized medially and the plane between the mesocolon and the anterior layer of Gerota's fascia to be developed and the kidney to be exposed.  The ureter and gonadal vein were identified inferiorly and the ureter was lifted anteriorly off the psoas muscle.  Dissection proceeded superiorly along the gonadal vein until the renal vein was identified.  The renal hilum was then carefully isolated with a  combination of blunt and sharp dissection allowing the renal arterial and venous structures to be separated and isolated in preparation for renal hilar vessel clamping. There was a single renal artery and vein.   Attention turned to the kidney and the  perinephric fat surrounding the renal mass was removed and the kidney was mobilized sufficiently for exposure and resection of the renal mass.    Once the renal mass was properly isolated, preparations were made for resection of the tumor.  Reconstructive sutures were placed into the abdomen for the renorrhaphy portion of the procedure.  The renal artery was then clamped with bulldog clamps.  The tumor was then excised with cold scissor dissection along with an adequate visible gross margin of normal renal parenchyma. The tumor appeared to be excised without any gross violation of the tumor. The renal collecting system was entered during removal of the tumor.  A running 3-0 V-lock suture was then brought through the capsule of the kidney and run along the base of the renal defect to provide hemostasis and close any entry into the renal collecting system if present. Weck clips were used to secure this suture outside the renal capsule at the proximal and distal ends. An additional hemostatic agent (Surgiflo) was then placed into the renal defect. A running 2-0 V lock suture was then used to close the renal capsule using a sliding clip technique which resulted in excellent compression of the renal defect.    The bulldog clamps were then removed from the renal hilar vessel(s). Total warm renal ischemia time was 14 minutes. The renal tumor resection site was examined. Hemostasis appeared adequate.   The kidney was placed back into its normal anatomic position and covered with perinephric fat as needed.  A # 14 Blake drain was then brought through the lateral lower port site and positioned in the perinephric space.  It was secured to the skin with a nylon suture. The surgical cart was undocked.  The renal tumor specimen was removed intact within an endopouch retrieval bag via the upper midline port site.  All other laparoscopic/robotic ports had been removed under direct vision and the pneumoperitoneum let down with  inspection of the operative field performed and hemostasis again confirmed. The upper midline incision was then closed at the fascial level with 0-vicryl suture. All incision sites were then injected with local anesthetic and reapproximated at the skin level with 4-0 monocryl subcuticular closures.  Liquiband was applied to the skin.  The patient tolerated the procedure well and without complications.  The patient was able to be extubated and transferred to the recovery unit in satisfactory condition.  Pryor Curia MD

## 2019-04-07 NOTE — Plan of Care (Signed)
  Problem: Education: Goal: Knowledge of the prescribed therapeutic regimen will improve Outcome: Progressing   Problem: Clinical Measurements: Goal: Postoperative complications will be avoided or minimized Outcome: Progressing   Problem: Respiratory: Goal: Ability to achieve and maintain a regular respiratory rate will improve Outcome: Progressing   Problem: Skin Integrity: Goal: Demonstration of wound healing without infection will improve Outcome: Progressing   Problem: Urinary Elimination: Goal: Ability to avoid or minimize complications of infection will improve Outcome: Progressing Goal: Ability to achieve and maintain urine output will improve Outcome: Progressing   Problem: Education: Goal: Knowledge of General Education information will improve Description: Including pain rating scale, medication(s)/side effects and non-pharmacologic comfort measures Outcome: Progressing   Problem: Health Behavior/Discharge Planning: Goal: Ability to manage health-related needs will improve Outcome: Progressing   Problem: Clinical Measurements: Goal: Ability to maintain clinical measurements within normal limits will improve Outcome: Progressing Goal: Will remain free from infection Outcome: Progressing Goal: Diagnostic test results will improve Outcome: Progressing Goal: Respiratory complications will improve Outcome: Progressing Goal: Cardiovascular complication will be avoided Outcome: Progressing   Problem: Activity: Goal: Risk for activity intolerance will decrease Outcome: Progressing   Problem: Nutrition: Goal: Adequate nutrition will be maintained Outcome: Progressing   Problem: Coping: Goal: Level of anxiety will decrease Outcome: Progressing   Problem: Pain Managment: Goal: General experience of comfort will improve Outcome: Progressing   Problem: Safety: Goal: Ability to remain free from injury will improve Outcome: Progressing   Problem: Skin  Integrity: Goal: Risk for impaired skin integrity will decrease Outcome: Progressing

## 2019-04-08 ENCOUNTER — Encounter: Payer: Self-pay | Admitting: *Deleted

## 2019-04-08 DIAGNOSIS — C642 Malignant neoplasm of left kidney, except renal pelvis: Secondary | ICD-10-CM | POA: Diagnosis not present

## 2019-04-08 LAB — BASIC METABOLIC PANEL
Anion gap: 7 (ref 5–15)
BUN: 7 mg/dL (ref 6–20)
CO2: 22 mmol/L (ref 22–32)
Calcium: 8.8 mg/dL — ABNORMAL LOW (ref 8.9–10.3)
Chloride: 106 mmol/L (ref 98–111)
Creatinine, Ser: 0.89 mg/dL (ref 0.44–1.00)
GFR calc Af Amer: >60 mL/min (ref >60)
GFR calc non Af Amer: >60 mL/min (ref >60)
Glucose, Bld: 127 mg/dL — ABNORMAL HIGH (ref 70–99)
Potassium: 4.5 mmol/L (ref 3.5–5.1)
Sodium: 135 mmol/L (ref 135–145)

## 2019-04-08 LAB — CREATININE, FLUID (PLEURAL, PERITONEAL, JP DRAINAGE): Creat, Fluid: 1.1 mg/dL

## 2019-04-08 LAB — HEMOGLOBIN AND HEMATOCRIT, BLOOD
HCT: 41.2 % (ref 36.0–46.0)
Hemoglobin: 13.4 g/dL (ref 12.0–15.0)

## 2019-04-08 LAB — SURGICAL PATHOLOGY

## 2019-04-08 MED ORDER — CHLORHEXIDINE GLUCONATE CLOTH 2 % EX PADS
6.0000 | MEDICATED_PAD | Freq: Every day | CUTANEOUS | Status: DC
  Administered 2019-04-08: 06:00:00 6 via TOPICAL

## 2019-04-08 MED ORDER — HYDROCODONE-ACETAMINOPHEN 5-325 MG PO TABS: 1.0000 | ORAL_TABLET | ORAL | 0 refills | Status: AC | PRN

## 2019-04-08 MED ORDER — HYDROCODONE-ACETAMINOPHEN 5-325 MG PO TABS
1.0000 | ORAL_TABLET | ORAL | Status: DC | PRN
  Administered 2019-04-08: 2 via ORAL
  Filled 2019-04-08: qty 2

## 2019-04-08 MED ORDER — BISACODYL 10 MG RE SUPP
10.0000 mg | Freq: Once | RECTAL | Status: AC
  Administered 2019-04-08: 08:00:00 10 mg via RECTAL
  Filled 2019-04-08: qty 1

## 2019-04-08 MED ORDER — HYDROCODONE-ACETAMINOPHEN 5-325 MG PO TABS
1.0000 | ORAL_TABLET | Freq: Four times a day (QID) | ORAL | Status: DC | PRN
  Administered 2019-04-08: 08:00:00 2 via ORAL
  Filled 2019-04-08: qty 2

## 2019-04-08 NOTE — Discharge Instructions (Signed)

## 2019-04-08 NOTE — Discharge Summary (Signed)
[facility] Discharge Summary  Admit date: 04/07/2019  Discharge date and time: 04/08/19   Discharge to: Home  Discharge Service: Urology  Discharge Attending Physician:  Dr. Dutch Gray  Discharge  Diagnoses: Renal mass  Secondary Diagnosis: Active Problems:   Neoplasm of left kidney   OR Procedures: Procedure(s): XI ROBOTIC ASSITED LAPAROSCOPIC PARTIAL NEPHRECTOMY CYSTOSCOPY 04/07/2019   Ancillary Procedures: None   Discharge Day Services: The patient was seen and examined by the Urology team both in the morning and immediately prior to discharge.  Vital signs and laboratory values were stable and within normal limits. JP Creatinine consistent with serum, therefore removed. Passed flatus and TOV. The physical exam was benign and unchanged and all surgical wounds were examined.  Discharge instructions were explained and all questions answered.  Subjective  No acute events overnight. Pain Controlled. No fever or chills.  Objective Patient Vitals for the past 8 hrs:  BP Temp Temp src Pulse Resp SpO2  04/08/19 1341 (!) 141/83 98.4 F (36.9 C) Oral 75 16 96 %   Total I/O In: -  Out: 300 [Urine:300]  General Appearance:        No acute distress Lungs:                       Normal work of breathing on room air Heart:                                Regular rate and rhythm Abdomen:                         Soft, appropriately-tender, non-distended Extremities:                      Warm and well perfused   Hospital Course:  The patient underwent left partial nephrectomy on 04/07/2019.  The patient tolerated the procedure well, was extubated in the OR, and afterwards was taken to the PACU for routine post-surgical care. When stable the patient was transferred to the floor.   The patient did well postoperatively.  The patient's diet was slowly advanced and at the time of discharge was tolerating a regular diet. JP Creatinine consistent with serum, therefore removed. Passed  flatus and TOV. The patient was discharged home 1 Day Post-Op, at which point was tolerating a regular solid diet, was able to void spontaneously, have adequate pain control with P.O. pain medication, and could ambulate without difficulty. The patient will follow up with [place] for post op check.   Condition at Discharge: Improved  Discharge Medications:  Allergies as of 04/08/2019      Reactions   Ace Inhibitors Cough   Codeine Nausea And Vomiting   Enbrel [etanercept] Hives   Sulfa Antibiotics Nausea And Vomiting      Medication List    STOP taking these medications   aspirin EC 81 MG tablet   diclofenac 75 MG EC tablet Commonly known as: VOLTAREN   Vitamin D3 250 MCG (10000 UT) Tabs     TAKE these medications   atorvastatin 10 MG tablet Commonly known as: LIPITOR Take 10 mg by mouth every 7 (seven) days.   estradiol 0.1 MG/24HR patch Commonly known as: VIVELLE-DOT Place 1 patch onto the skin 2 (two) times a week.   folic acid 1 MG tablet Commonly known as: FOLVITE Take 1 mg by mouth daily.  gabapentin 300 MG capsule Commonly known as: NEURONTIN Take 300 mg by mouth 3 (three) times daily as needed (pain).   methocarbamol 750 MG tablet Commonly known as: ROBAXIN Take 750 mg by mouth 3 (three) times daily as needed for muscle spasms.   ondansetron 8 MG tablet Commonly known as: ZOFRAN Take 8 mg by mouth every 8 (eight) hours as needed for nausea or vomiting.   Otrexup 25 MG/0.4ML Soaj Generic drug: Methotrexate (PF) Inject 25 mg into the skin every 7 (seven) days.   pantoprazole 40 MG tablet Commonly known as: PROTONIX Take 40 mg by mouth daily.   traMADol 50 MG tablet Commonly known as: ULTRAM Take 1-2 tablets (50-100 mg total) by mouth every 6 (six) hours as needed for moderate pain. What changed: how much to take

## 2019-04-08 NOTE — Progress Notes (Signed)
Patient ID: Julia Mcpherson, female   DOB: 1965-10-01, 53 y.o.   MRN: [id number]  1 Day Post-Op Subjective: Doing well.  Back pain that has occurred intermittently likely due to laying in bed per patient report.  Pain controlled.  Passed flatus.  Objective: Vital signs in last 24 hours: Temp:  [97.9 F (36.6 C)-98.6 F (37 C)] 98.2 F (36.8 C) (12/15 0553) Pulse Rate:  [72-90] 72 (12/15 0553) Resp:  [19] 19 (12/14 1311) BP: (110-148)/(54-86) 115/62 (12/15 0553) SpO2:  [94 %-100 %] 96 % (12/15 0553)  Intake/Output from previous day: 12/14 0701 - 12/15 0700 In: 5152.3 [P.O.:180; I.V.:4491.2; IV Piggyback:481.1] Out: 2940 [Urine:2800; Drains:120; Blood:20] Intake/Output this shift: No intake/output data recorded.  Physical Exam:  General: Alert and oriented CV: RRR Lungs: Clear Abdomen: Soft, ND, appropriate tenderness Incisions:C/D/I Ext: NT, No erythema  Lab Results: Recent Labs    04/07/19 1215 04/08/19 0627  HGB 14.9 13.4  HCT 45.9 41.2   BMET Recent Labs    04/07/19 1215 04/08/19 0627  NA 136 135  K 3.7 4.5  CL 102 106  CO2 21* 22  GLUCOSE 159* 127*  BUN 10 7  CREATININE 1.12* 0.89  CALCIUM 9.2 8.8*     Studies/Results: No results found.  Assessment/Plan: POD # 1 s/p left RAL partial nephrectomy - Ambulate, IS - DVT prophylaxis - Advance diet - Oral pain medication - Monitor renal function - Suppository   LOS: 0 days   Dutch Gray 04/08/2019, 7:32 AM

## 2019-04-09 NOTE — Anesthesia Postprocedure Evaluation (Signed)
Anesthesia Post Note  Patient: SHAHED GREENHUT  Procedure(s) Performed: XI ROBOTIC ASSITED LAPAROSCOPIC PARTIAL NEPHRECTOMY (Left ) CYSTOSCOPY (N/A )     Patient location during evaluation: PACU Anesthesia Type: General Level of consciousness: awake and alert Pain management: pain level controlled Vital Signs Assessment: post-procedure vital signs reviewed and stable Respiratory status: spontaneous breathing, nonlabored ventilation, respiratory function stable and patient connected to nasal cannula oxygen Cardiovascular status: blood pressure returned to baseline and stable Postop Assessment: no apparent nausea or vomiting Anesthetic complications: no    Last Vitals:  Vitals:   04/08/19 0553 04/08/19 1341  BP: 115/62 (!) 141/83  Pulse: 72 75  Resp:  16  Temp: 36.8 C 36.9 C  SpO2: 96% 96%    Last Pain:  Vitals:   04/08/19 1341  TempSrc: Oral  PainSc:                  [place]

## 2019-11-14 ENCOUNTER — Other Ambulatory Visit (HOSPITAL_COMMUNITY): Payer: Self-pay | Admitting: Urology

## 2019-11-14 ENCOUNTER — Ambulatory Visit (HOSPITAL_COMMUNITY)
Admission: RE | Admit: 2019-11-14 | Discharge: 2019-11-14 | Disposition: A | Discharge status: Home / Self Care | Payer: 59 | Source: Ambulatory Visit | Attending: Urology | Admitting: Urology

## 2019-11-14 ENCOUNTER — Other Ambulatory Visit: Payer: Self-pay

## 2019-11-14 DIAGNOSIS — C642 Malignant neoplasm of left kidney, except renal pelvis: Secondary | ICD-10-CM | POA: Insufficient documentation

## 2021-03-29 IMAGING — DX DG CHEST 2V
2 series · 2 of 2 positions shown · non-contrast
Comparison: None.

CLINICAL DATA: 54 y.o. female has no current chest complaints.
Patient states partial nephrectomy of left kidney 04/07/2019

EXAM:
CHEST - 2 VIEW

[chest pa]
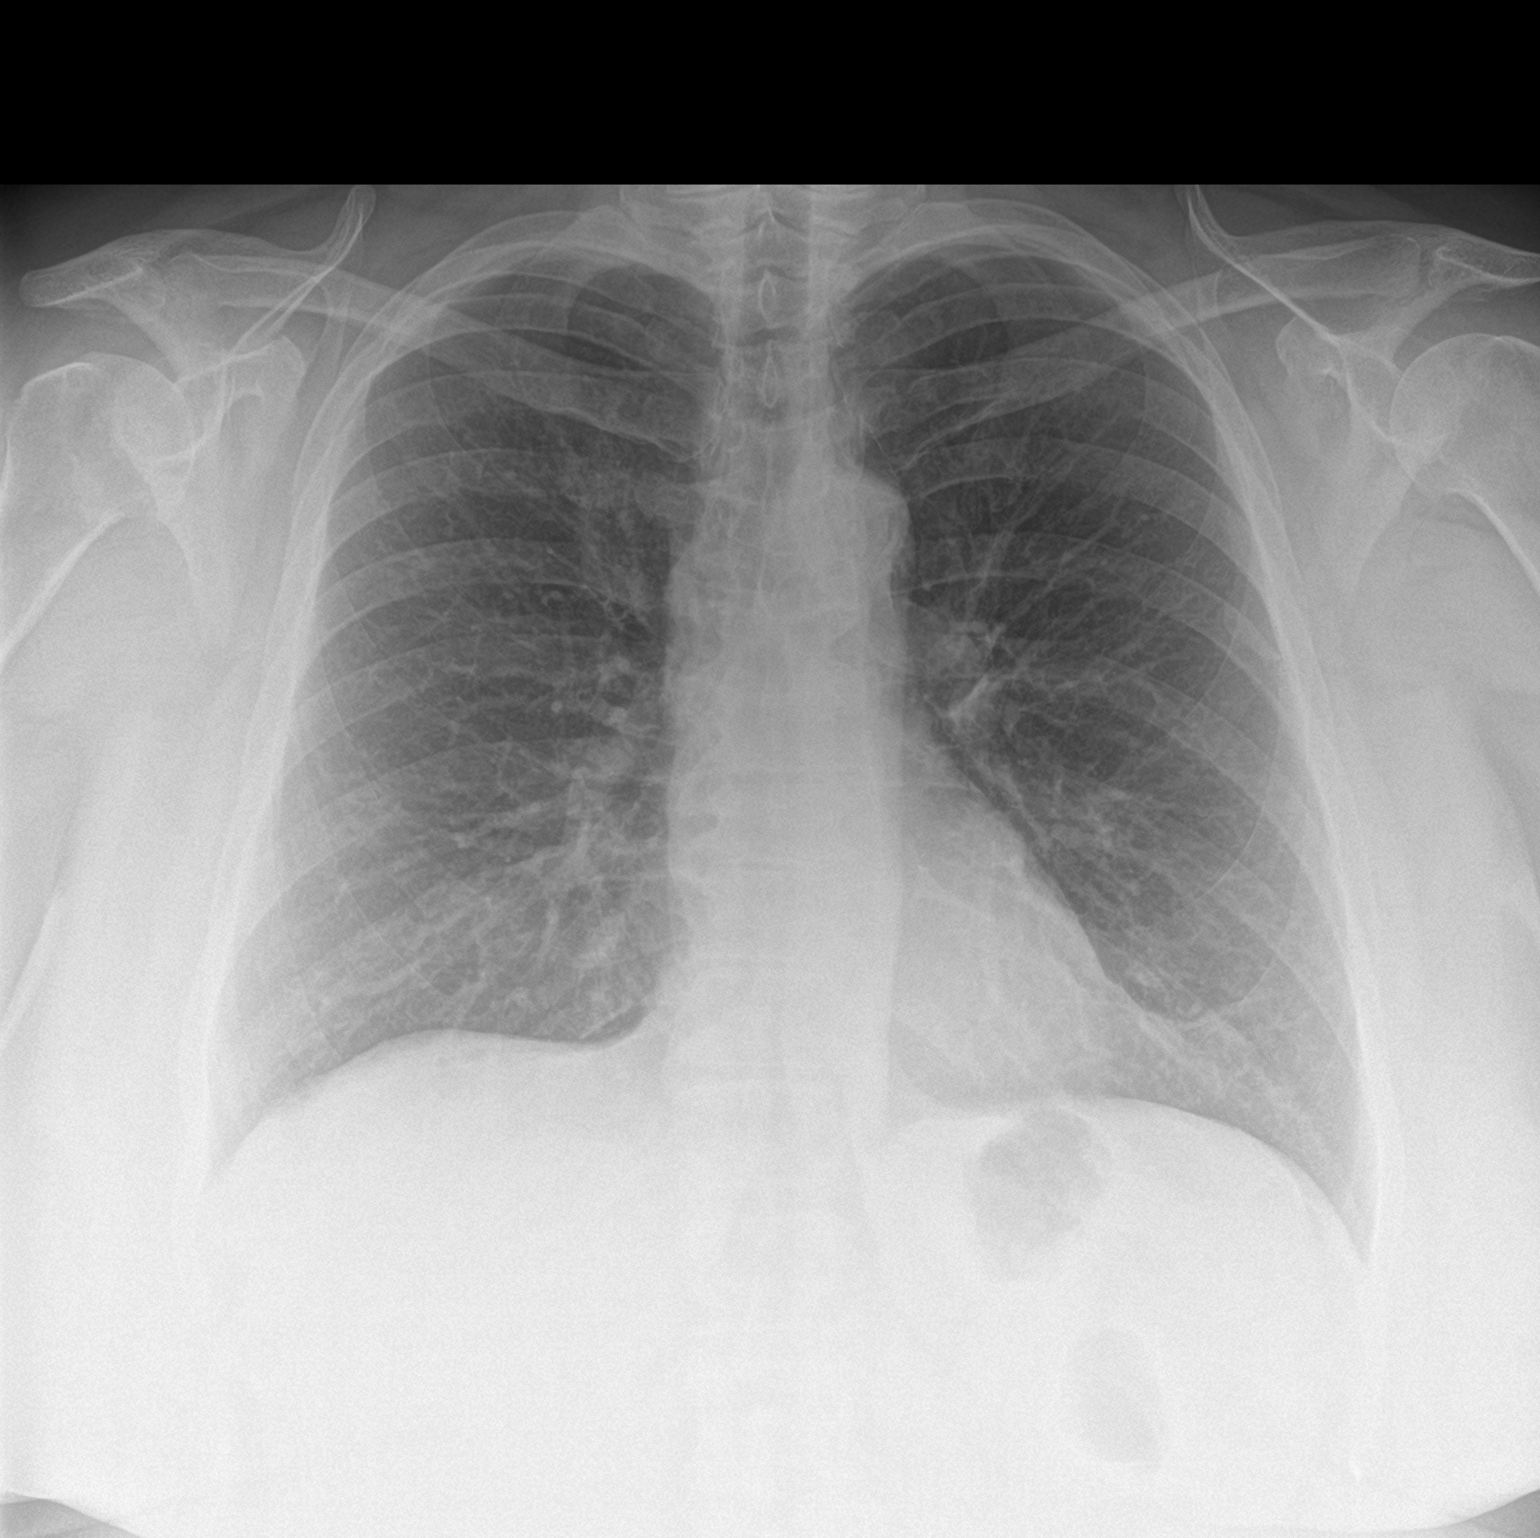

[chest lat]
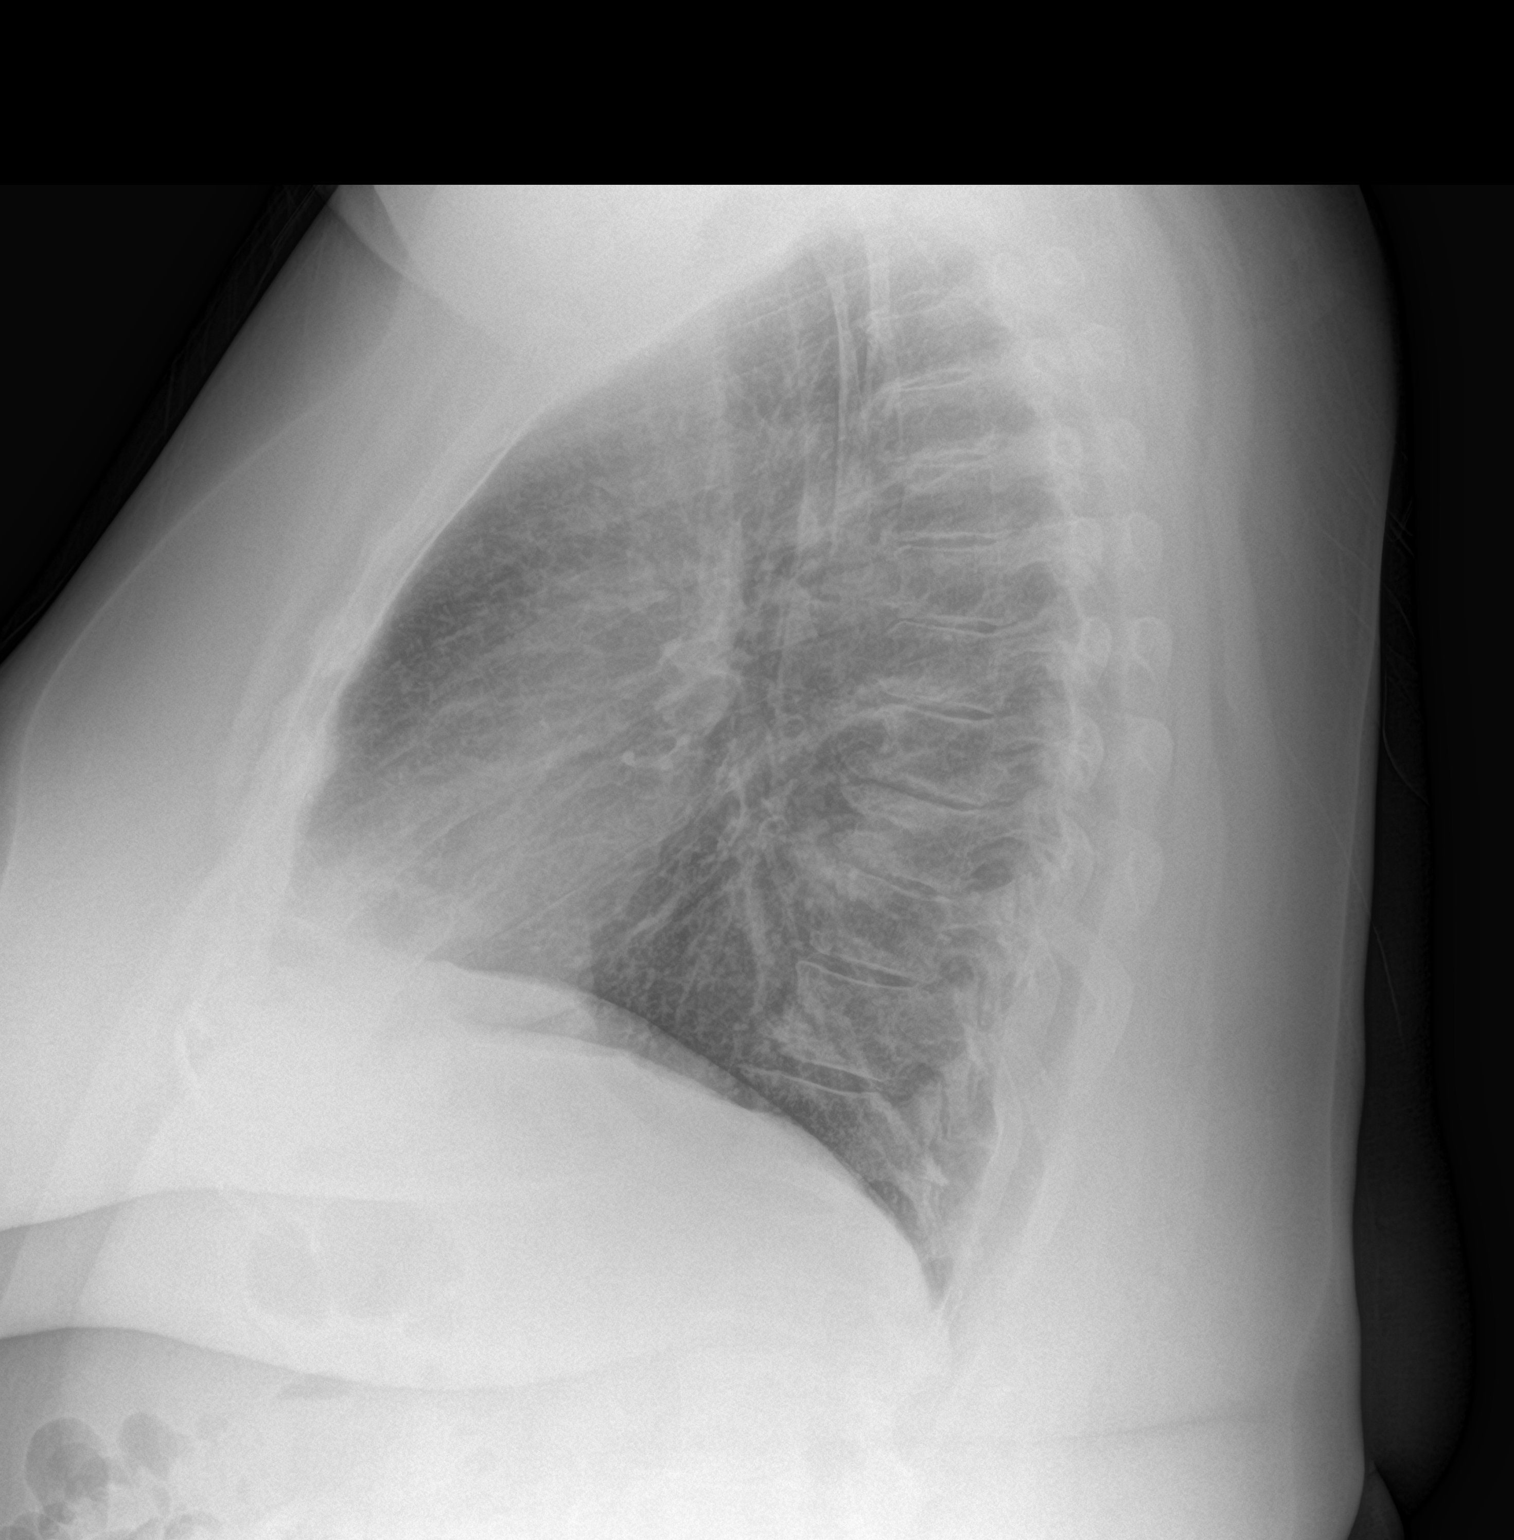

[2 of 2 positions shown; findings below may reference images not displayed]

FINDINGS: Cardiac silhouette is normal in size. Normal mediastinal and hilar
contours.

Lungs are clear.  Specifically, no lung mass or nodule.

No pleural effusion or pneumothorax.

Skeletal structures are intact.
IMPRESSION: No active cardiopulmonary disease.
